# Patient Record
Sex: Female | Born: 1980 | Race: White | Hispanic: No | Marital: Married | Smoking: Never smoker
Health system: Southern US, Community
[De-identification: ages and names within clinical notes are randomized; demographics above are authoritative.]

## PROBLEM LIST (undated history)

## (undated) DIAGNOSIS — F419 Anxiety disorder, unspecified: Secondary | ICD-10-CM

## (undated) DIAGNOSIS — K219 Gastro-esophageal reflux disease without esophagitis: Secondary | ICD-10-CM

## (undated) DIAGNOSIS — I10 Essential (primary) hypertension: Secondary | ICD-10-CM

## (undated) HISTORY — DX: Essential (primary) hypertension: I10

## (undated) HISTORY — DX: Anxiety disorder, unspecified: F41.9

## (undated) HISTORY — DX: Gastro-esophageal reflux disease without esophagitis: K21.9

---
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
## 2000-01-20 ENCOUNTER — Other Ambulatory Visit: Admission: RE | Admit: 2000-01-20 | Discharge: 2000-01-20 | Payer: Self-pay | Admitting: Family Medicine

## 2001-03-16 ENCOUNTER — Other Ambulatory Visit: Admission: RE | Admit: 2001-03-16 | Discharge: 2001-03-16 | Payer: Self-pay | Admitting: Family Medicine

## 2002-02-07 ENCOUNTER — Other Ambulatory Visit: Admission: RE | Admit: 2002-02-07 | Discharge: 2002-02-07 | Payer: Self-pay | Admitting: Family Medicine

## 2003-03-13 ENCOUNTER — Other Ambulatory Visit: Admission: RE | Admit: 2003-03-13 | Discharge: 2003-03-13 | Payer: Self-pay | Admitting: Family Medicine

## 2004-02-12 ENCOUNTER — Other Ambulatory Visit: Admission: RE | Admit: 2004-02-12 | Discharge: 2004-02-12 | Payer: Self-pay | Admitting: Family Medicine

## 2005-02-19 ENCOUNTER — Other Ambulatory Visit: Admission: RE | Admit: 2005-02-19 | Discharge: 2005-02-19 | Payer: Self-pay | Admitting: Family Medicine

## 2006-04-13 ENCOUNTER — Other Ambulatory Visit: Admission: RE | Admit: 2006-04-13 | Discharge: 2006-04-13 | Payer: Self-pay | Admitting: Family Medicine

## 2007-02-15 ENCOUNTER — Other Ambulatory Visit: Admission: RE | Admit: 2007-02-15 | Discharge: 2007-02-15 | Payer: Self-pay | Admitting: Family Medicine

## 2008-04-23 ENCOUNTER — Other Ambulatory Visit: Admission: RE | Admit: 2008-04-23 | Discharge: 2008-04-23 | Payer: Self-pay | Admitting: Family Medicine

## 2008-08-17 ENCOUNTER — Ambulatory Visit (HOSPITAL_BASED_OUTPATIENT_CLINIC_OR_DEPARTMENT_OTHER): Admission: RE | Admit: 2008-08-17 | Discharge: 2008-08-17 | Payer: Self-pay | Admitting: Family Medicine

## 2009-04-24 ENCOUNTER — Other Ambulatory Visit: Admission: RE | Admit: 2009-04-24 | Discharge: 2009-04-24 | Payer: Self-pay | Admitting: Family Medicine

## 2009-10-03 ENCOUNTER — Ambulatory Visit (HOSPITAL_COMMUNITY): Admission: RE | Admit: 2009-10-03 | Discharge: 2009-10-03 | Payer: Self-pay | Admitting: Obstetrics and Gynecology

## 2010-01-16 ENCOUNTER — Inpatient Hospital Stay (HOSPITAL_COMMUNITY): Admission: AD | Admit: 2010-01-16 | Discharge: 2010-01-16 | Payer: Self-pay | Admitting: Obstetrics and Gynecology

## 2010-02-04 ENCOUNTER — Inpatient Hospital Stay (HOSPITAL_COMMUNITY): Admission: AD | Admit: 2010-02-04 | Discharge: 2010-02-07 | Payer: Self-pay | Admitting: Obstetrics and Gynecology

## 2010-04-13 IMAGING — US US ABDOMEN COMPLETE
1 series · 14 of 25 positions shown · non-contrast
Comparison: None

CLINICAL DATA: Right upper quadrant pain.  Nausea vomiting diarrhea
for 5 months.

ABDOMEN ULTRASOUND
TECHNIQUE: Complete abdominal ultrasound examination was performed
including evaluation of the liver, gallbladder, bile ducts,
pancreas, kidneys, spleen, IVC, and abdominal aorta.

[Series 1: us abdomen complete · 0.30mm/px · 14 of 73 slices shown]
[im 1/73]
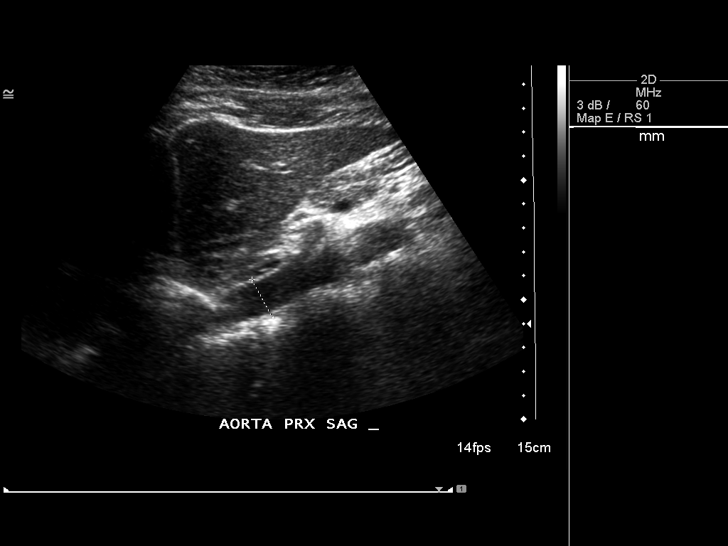
[im 7/73]
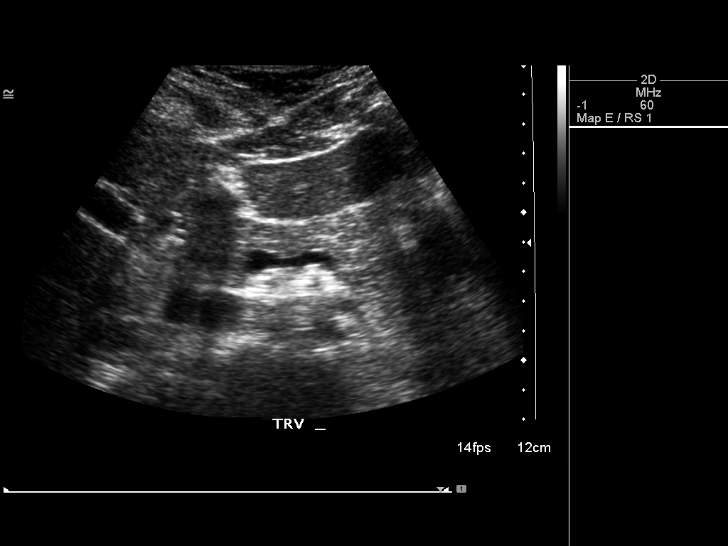
[im 13/73]
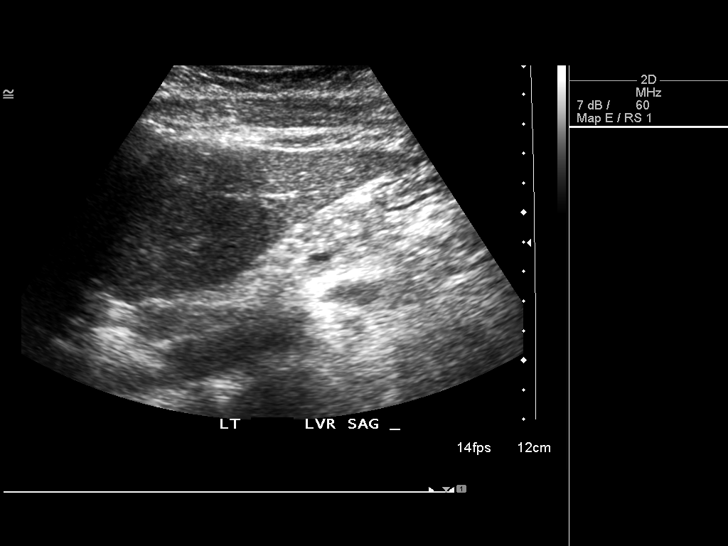
[im 19/73]
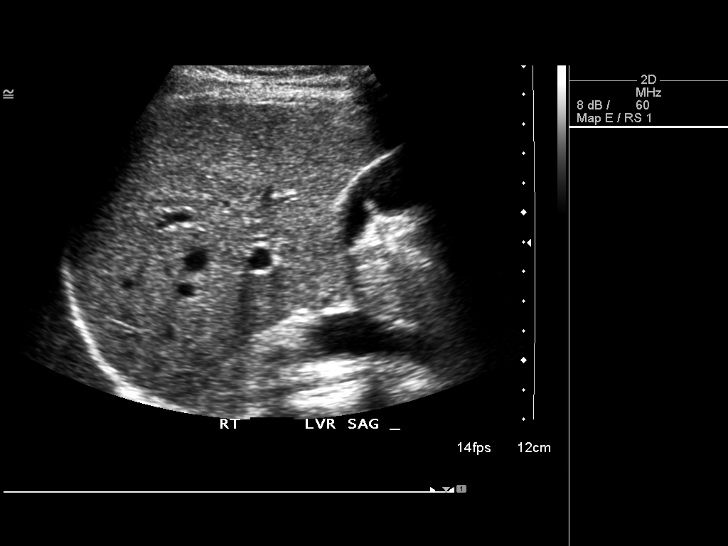
[im 25/73]
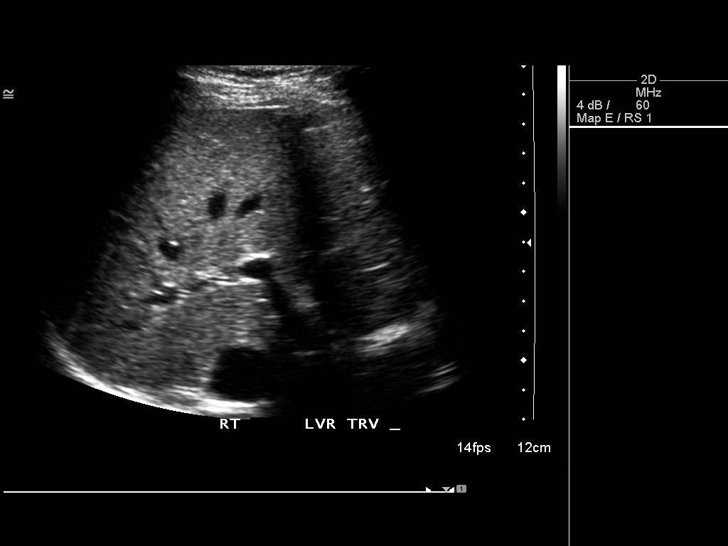
[im 28/73]
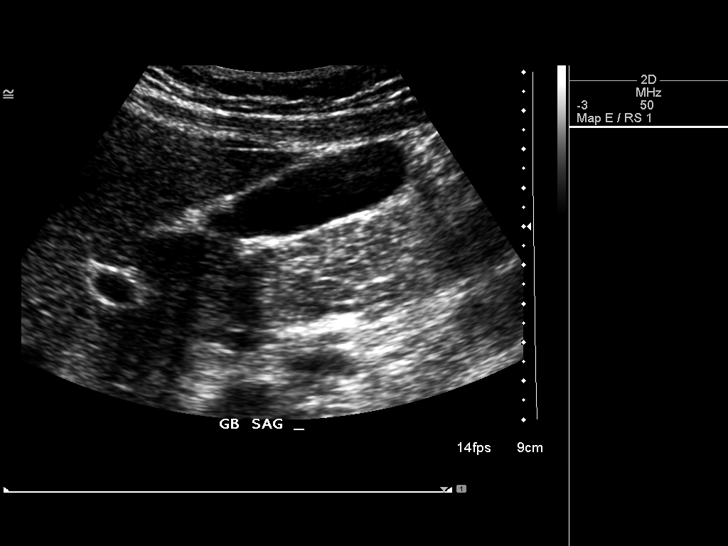
[im 34/73]
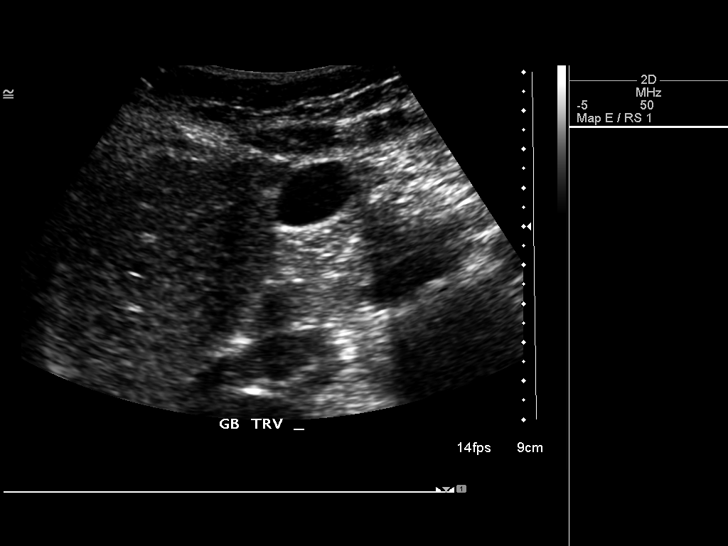
[im 40/73]
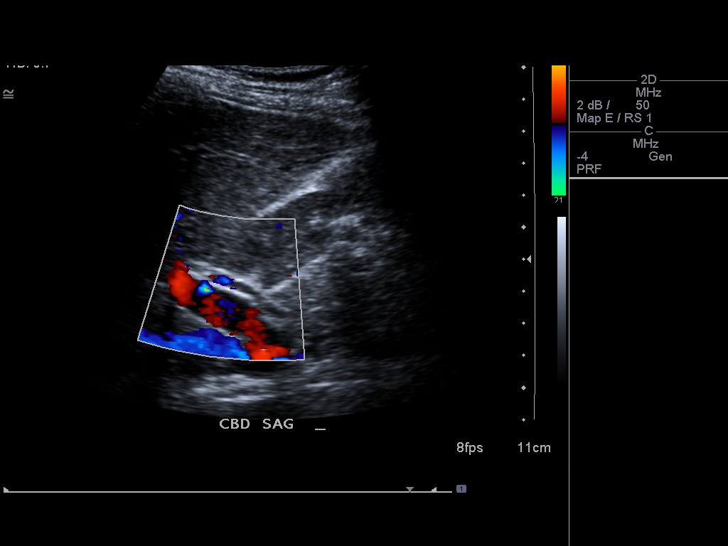
[im 46/73]
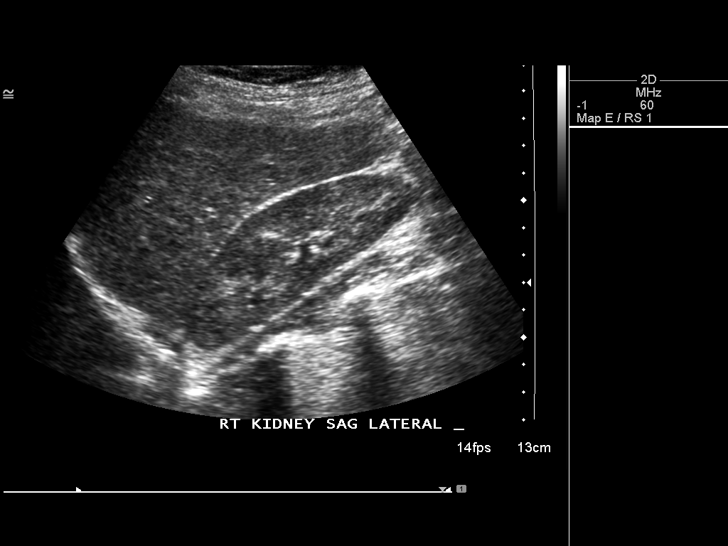
[im 49/73]
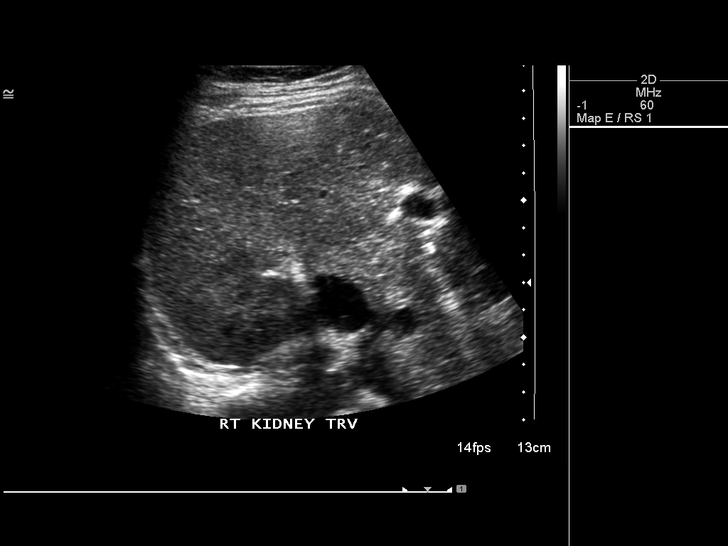
[im 55/73]
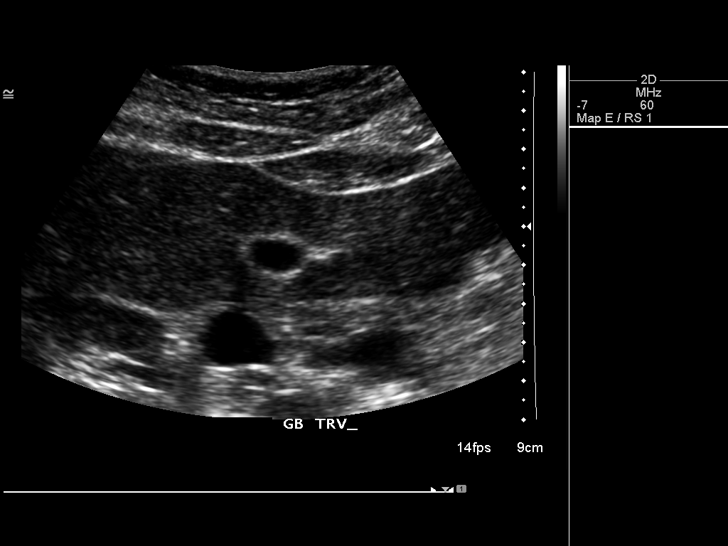
[im 61/73]
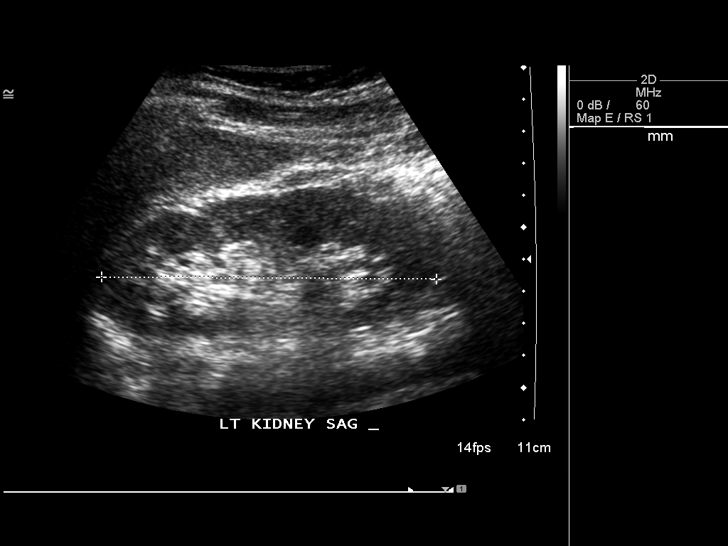
[im 67/73]
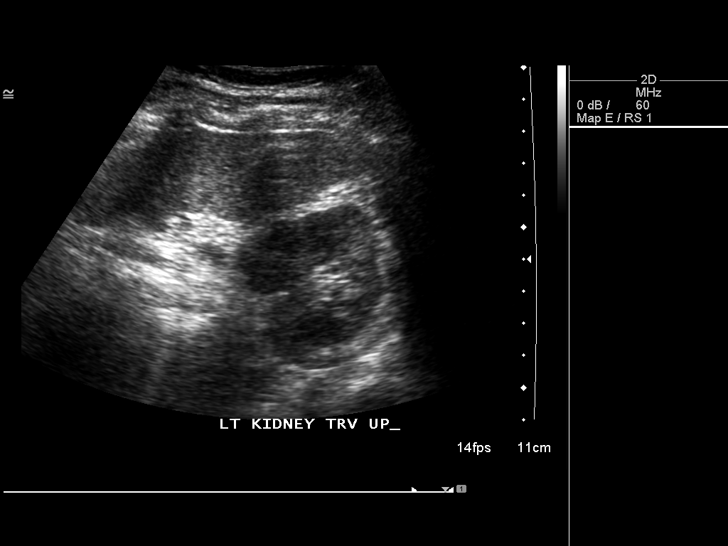
[im 73/73]
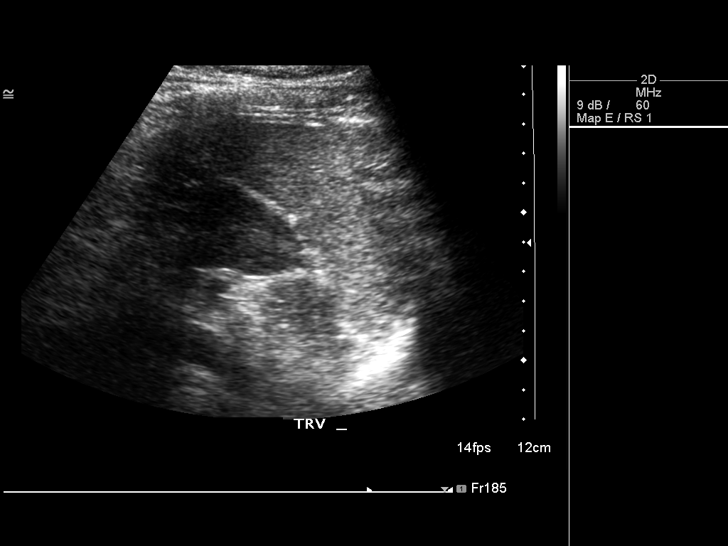

[14 of 25 positions shown; findings below may reference images not displayed]

FINDINGS: Gallbladder is normal without wall thickening, stone, or
pericholecystic fluid.  Sonographic Murphy's sign was not elicited.

Common duct normal at 3mm.

Liver, IVC, pancreas all within normal limits.

Spleen normal in size and echotexture.

Right kidney 10.9 cm and left kidney 10.4 cm.  No hydronephrosis.

Abdominal aorta nonaneurysmal without ascites.
IMPRESSION: No acute abdominal process.

## 2010-12-08 LAB — COMPREHENSIVE METABOLIC PANEL
ALT: 19 U/L (ref 0–35)
AST: 25 U/L (ref 0–37)
Albumin: 2.9 g/dL — ABNORMAL LOW (ref 3.5–5.2)
Alkaline Phosphatase: 180 U/L — ABNORMAL HIGH (ref 39–117)
Chloride: 105 mEq/L (ref 96–112)
GFR calc Af Amer: >60 mL/min (ref >60)
Sodium: 136 mEq/L (ref 135–145)
Total Bilirubin: 0.3 mg/dL (ref 0.3–1.2)

## 2010-12-08 LAB — CBC
HCT: 31.6 % — ABNORMAL LOW (ref 36.0–46.0)
MCHC: 34.1 g/dL (ref 30.0–36.0)
MCHC: 34.2 g/dL (ref 30.0–36.0)
MCV: 89.2 fL (ref 78.0–100.0)
Platelets: 140 10*3/uL — ABNORMAL LOW (ref 150–400)
Platelets: 163 10*3/uL (ref 150–400)
RBC: 4.14 MIL/uL (ref 3.87–5.11)
RBC: 4.15 MIL/uL (ref 3.87–5.11)
RDW: 13.9 % (ref 11.5–15.5)
WBC: 14.3 10*3/uL — ABNORMAL HIGH (ref 4.0–10.5)

## 2010-12-08 LAB — RPR: RPR Ser Ql: NONREACTIVE

## 2010-12-08 LAB — LACTATE DEHYDROGENASE: LDH: 188 U/L (ref 94–250)

## 2010-12-09 LAB — LACTATE DEHYDROGENASE: LDH: 166 U/L (ref 94–250)

## 2010-12-09 LAB — COMPREHENSIVE METABOLIC PANEL
ALT: 16 U/L (ref 0–35)
AST: 22 U/L (ref 0–37)
Albumin: 2.6 g/dL — ABNORMAL LOW (ref 3.5–5.2)
CO2: 25 mEq/L (ref 19–32)
Calcium: 8.9 mg/dL (ref 8.4–10.5)
Chloride: 104 mEq/L (ref 96–112)
Creatinine, Ser: 0.5 mg/dL (ref 0.4–1.2)
Total Bilirubin: 0.4 mg/dL (ref 0.3–1.2)

## 2010-12-09 LAB — CBC
MCHC: 34.4 g/dL (ref 30.0–36.0)
MCV: 88.6 fL (ref 78.0–100.0)
Platelets: 197 10*3/uL (ref 150–400)
RBC: 4.08 MIL/uL (ref 3.87–5.11)
RDW: 13 % (ref 11.5–15.5)
WBC: 9.4 10*3/uL (ref 4.0–10.5)

## 2010-12-09 LAB — URIC ACID: Uric Acid, Serum: 3.7 mg/dL (ref 2.4–7.0)

## 2011-05-30 IMAGING — US US ABDOMEN COMPLETE
1 series · 13 of 25 positions shown · non-contrast
Comparison: 08/17/2008

CLINICAL DATA: Right flank pain.  21 weeks estimated gestational
age

COMPLETE ABDOMINAL ULTRASOUND

[Series 1: us abdomen complete · 0.35mm/px · 13 of 73 slices shown]
[im 1/73]
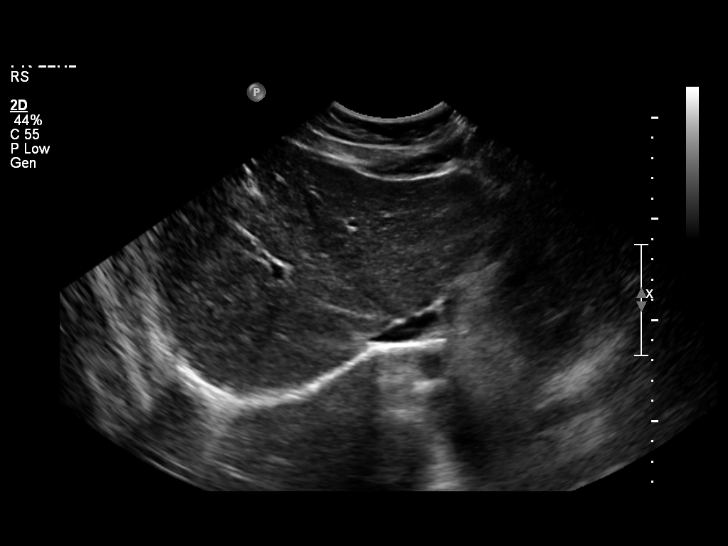
[im 7/73]
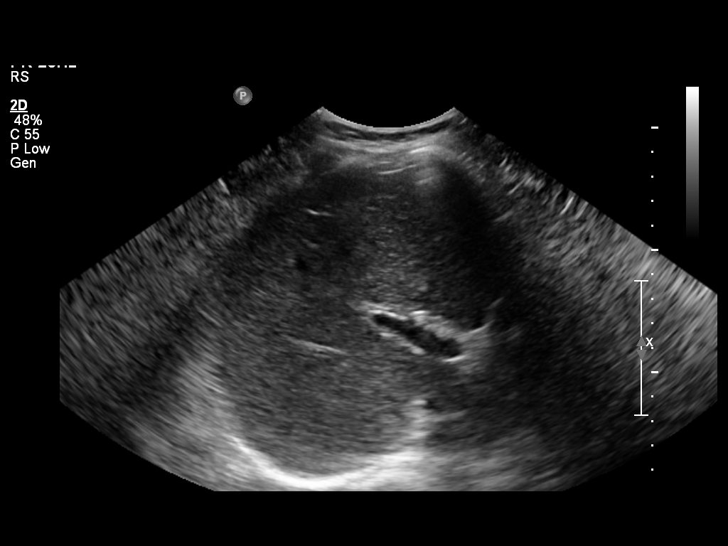
[im 13/73]
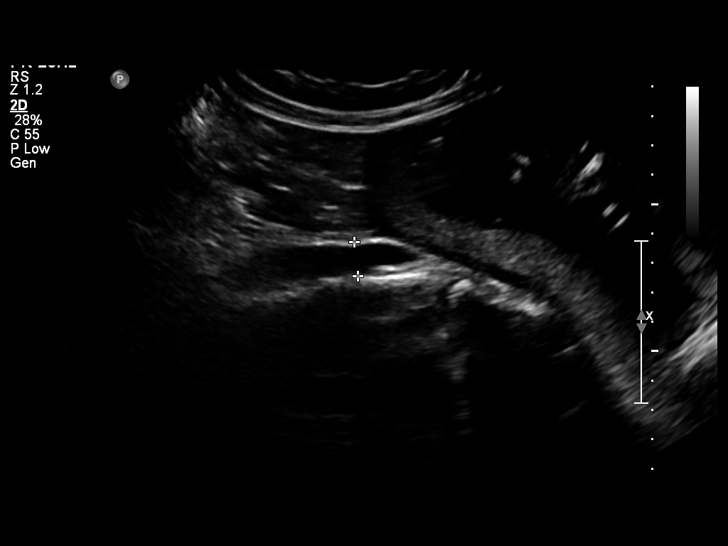
[im 19/73]
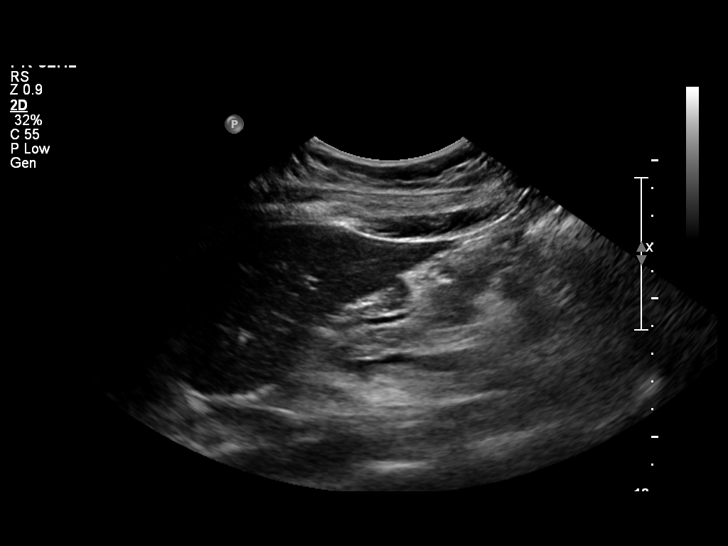
[im 25/73]
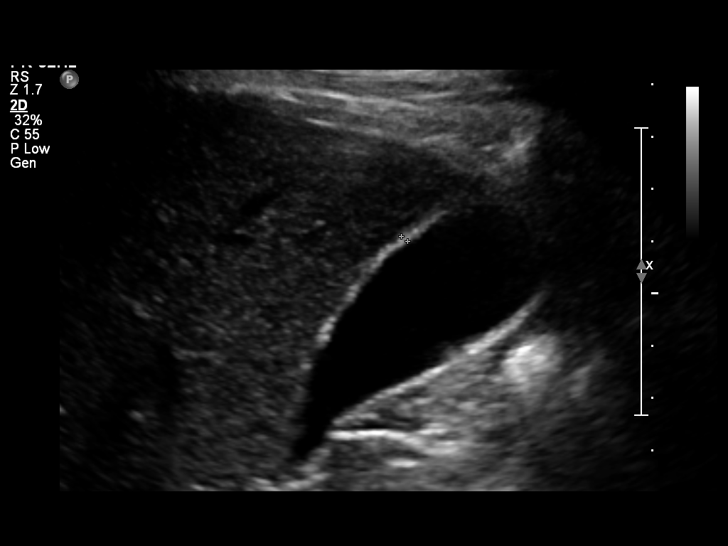
[im 31/73]
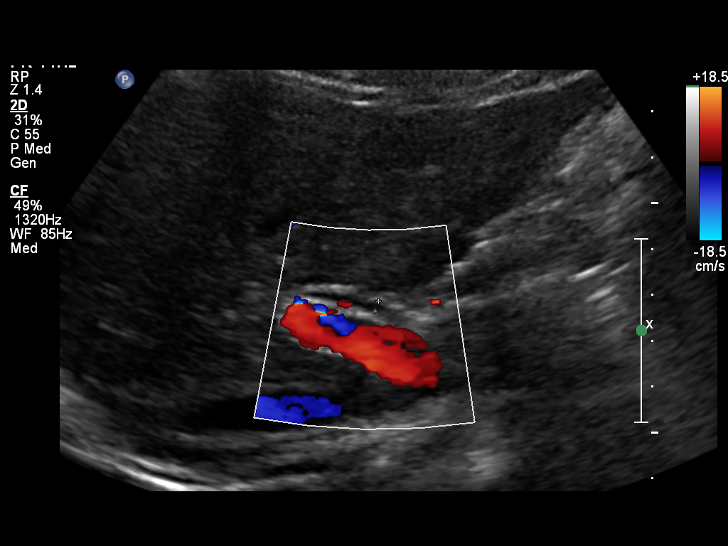
[im 37/73]
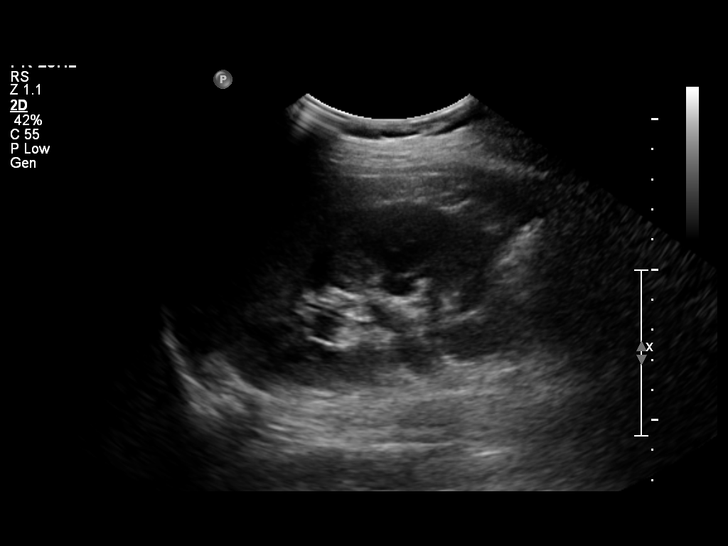
[im 43/73]
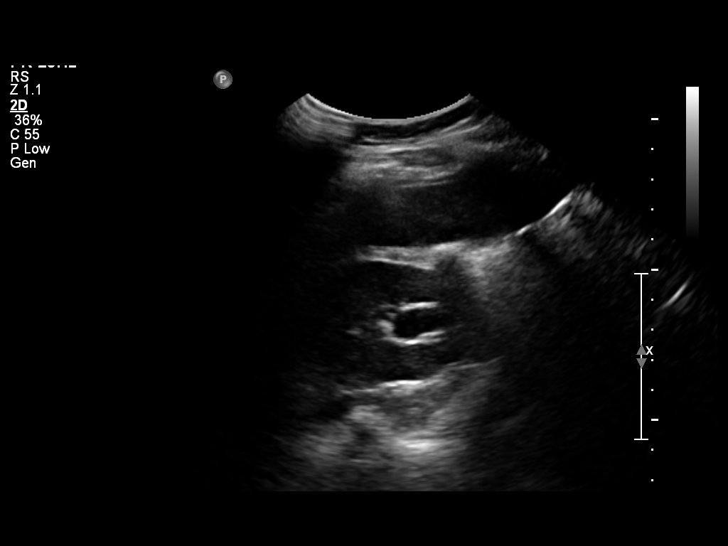
[im 49/73]
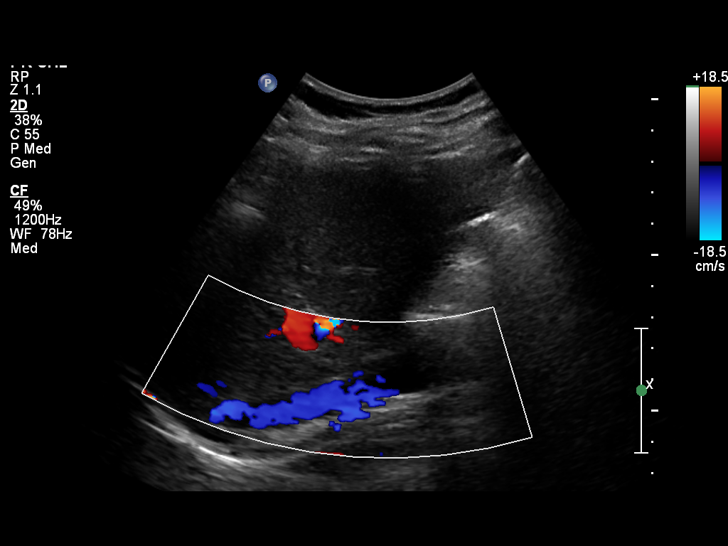
[im 55/73]
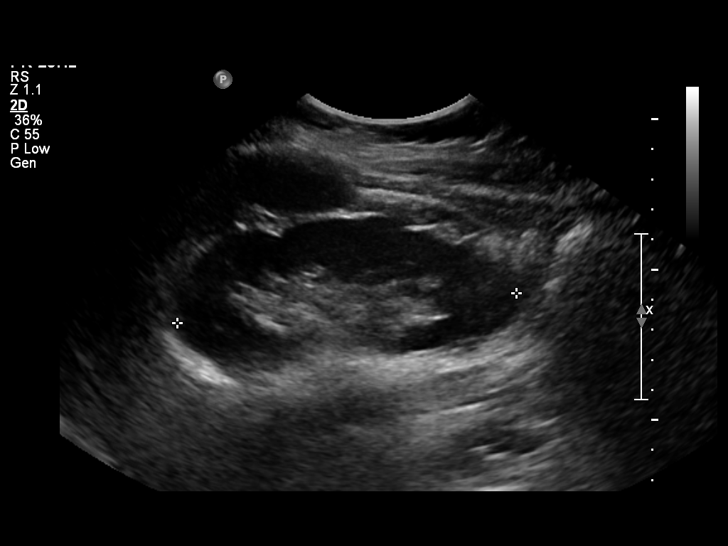
[im 61/73]
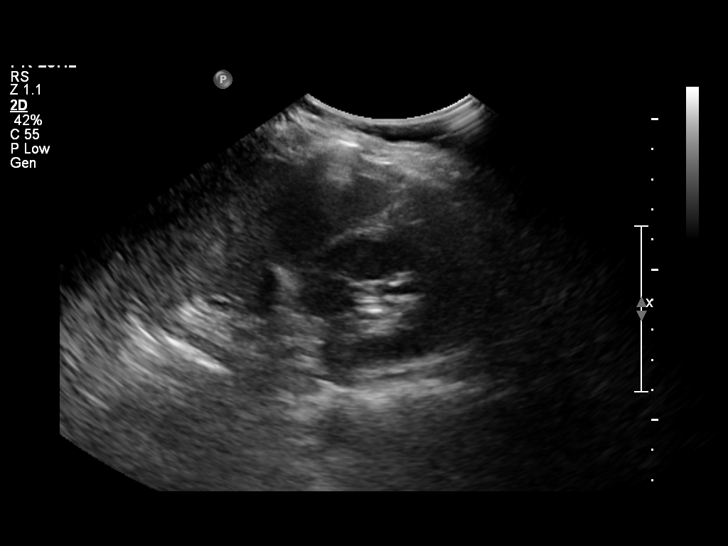
[im 67/73]
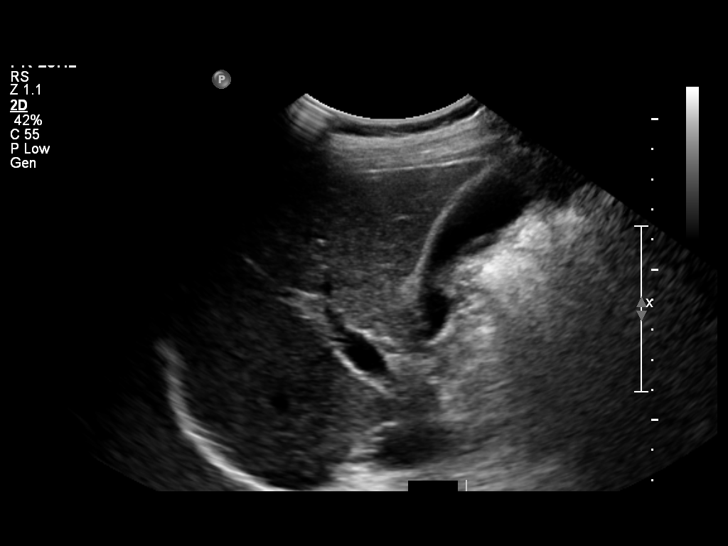
[im 73/73]
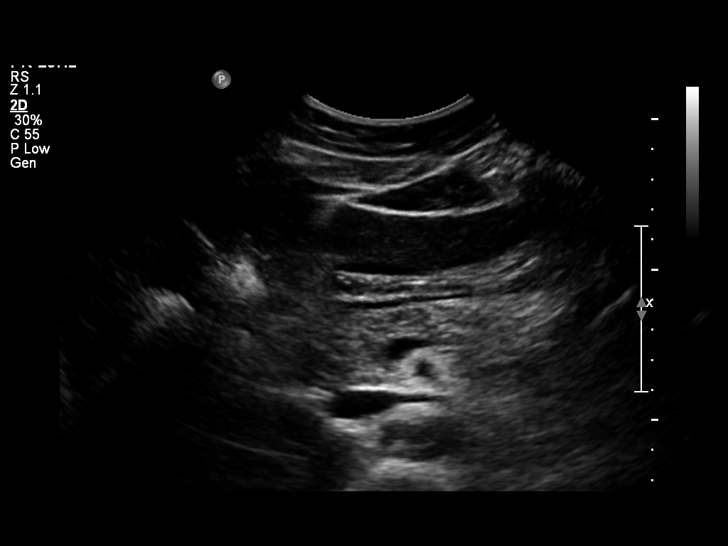

[13 of 25 positions shown; findings below may reference images not displayed]

FINDINGS: Gallbladder:  No gallstones, gallbladder wall thickening, or
pericholecystic fluid.

Common bile duct:  Measures 2.3 mm in AP width and has a normal
appearance.

Liver:  No focal lesion identified.  Within normal limits in
parenchymal echogenicity. No signs of intrahepatic ductal
dilatation are seen

IVC:  Appears normal.

Pancreas:  Normal in appearance in echogenicity in the region of
the head and body.  Portions of the tail are obscured by overlying
gas.

Spleen:  Demonstrates a sagittal length of 10.4 cm.  No focal
parenchymal abnormalities are seen and echogenicity is normal.

Right Kidney:  Demonstrates a sagittal length of 10.3 cm.  There is
some focal dilatation of the upper pole collecting system.  No
definite ureterectasis is seen and no focal parenchymal
abnormalities are noted

Left Kidney:  Demonstrates a sagittal length of 11.3 cm.  No focal
parenchymal abnormalities or signs of hydronephrosis are noted

Abdominal aorta:  No aneurysm identified.
IMPRESSION: Mild pyelectasis of the upper pole of the right collecting system
with no diffuse pyelectasis or ureterectasis seen.  No focal
parenchymal abnormality is noted to suggest the presence of renal
calculi and the degree of pyelectasis is not unexpected for the
patient's current estimated gestational age.  Otherwise
unremarkable abdominal ultrasound.

## 2013-11-08 ENCOUNTER — Telehealth: Payer: Self-pay | Admitting: *Deleted

## 2013-11-08 NOTE — Telephone Encounter (Signed)
error 

## 2017-09-16 LAB — OB RESULTS CONSOLE HEPATITIS B SURFACE ANTIGEN: HEP B S AG: NEGATIVE

## 2017-09-16 LAB — OB RESULTS CONSOLE ABO/RH: RH TYPE: POSITIVE

## 2017-09-16 LAB — OB RESULTS CONSOLE GC/CHLAMYDIA
Chlamydia: NEGATIVE
Gonorrhea: NEGATIVE

## 2017-09-16 LAB — OB RESULTS CONSOLE HIV ANTIBODY (ROUTINE TESTING): HIV: NONREACTIVE

## 2017-09-16 LAB — OB RESULTS CONSOLE RPR: RPR: NONREACTIVE

## 2017-09-16 LAB — OB RESULTS CONSOLE ANTIBODY SCREEN: Antibody Screen: NEGATIVE

## 2017-09-16 LAB — OB RESULTS CONSOLE RUBELLA ANTIBODY, IGM: Rubella: NON-IMMUNE/NOT IMMUNE

## 2017-09-21 NOTE — L&D Delivery Note (Signed)
Delivery Note At 8:04 PM a healthy female was delivered via Vaginal, Spontaneous (Presentation: LOA  ).  APGAR:8,9 ; weight pending .   Placenta status: delivered spontaneously.  Cord:  with the following complications: none.    Anesthesia:  epidural Episiotomy: None Lacerations:  Small second degree Suture Repair: 3.0 vicryl rapide Est. Blood Loss (mL):  175ml  Mom to postpartum.  Baby to Couplet care / Skin to Skin.  D/w pt circumcision and they desire to proceed, have not paid at office yet  Oliver PilaKathy W Clelia Trabucco 04/08/2018, 8:33 PM

## 2018-03-18 LAB — OB RESULTS CONSOLE GBS: GBS: NEGATIVE

## 2018-03-21 ENCOUNTER — Telehealth (HOSPITAL_COMMUNITY): Payer: Self-pay | Admitting: *Deleted

## 2018-03-21 ENCOUNTER — Encounter (HOSPITAL_COMMUNITY): Payer: Self-pay | Admitting: *Deleted

## 2018-03-21 NOTE — Telephone Encounter (Signed)
Preadmission screen  

## 2018-03-22 ENCOUNTER — Telehealth (HOSPITAL_COMMUNITY): Payer: Self-pay | Admitting: *Deleted

## 2018-03-22 NOTE — Telephone Encounter (Signed)
Preadmission screen  

## 2018-04-04 ENCOUNTER — Inpatient Hospital Stay (HOSPITAL_COMMUNITY): Admission: RE | Admit: 2018-04-04 | Payer: Self-pay | Source: Ambulatory Visit

## 2018-04-07 ENCOUNTER — Encounter (HOSPITAL_COMMUNITY): Payer: Self-pay

## 2018-04-07 NOTE — H&P (Signed)
Sonya Peters is a 37 y.o. female G2P1001 at 3939 0/7 weeks (EDD 04/14/18 by LMP c/w 9 week US) presenting for IOL at term with h/o CHTN, well-controlled on minimal medications entire pregnancy.  She is AMA but declined genetic screens.  There was a low lying placenta that resolved on f/u scan and though fundal height was larger than dates EFW is 58%ile on US.   SHe is rubella non-immune. OB History    Gravida  2   Para  1   Term  1   Preterm      AB      Living  1     SAB      TAB      Ectopic      Multiple      Live Births  1         2011 PIH 40+ weeks 7#15oz NSVD  Past Medical History:  Diagnosis Date  . Anxiety   . GERD (gastroesophageal reflux disease)   . Hypertension    Family History: family history is not on file. Social History:  has no tobacco, alcohol, and drug history on file.     Maternal Diabetes: No Genetic Screening: Declined Maternal Ultrasounds/Referrals: Normal Fetal Ultrasounds or other Referrals:  None Maternal Substance Abuse:  No Significant Maternal Medications:  Meds include: Other:   Labetalol Significant Maternal Lab Results:  Lab values include: Other: Rubella non-immune Other Comments:  None  Review of Systems  Eyes: Negative for blurred vision.  Gastrointestinal: Negative for abdominal pain.  Neurological: Negative for headaches.   Maternal Medical History:  Contractions: Frequency: irregular.   Perceived severity is mild.    Fetal activity: Perceived fetal activity is normal.    Prenatal complications: PIH.   Prenatal Complications - Diabetes: none.      Last menstrual period 07/08/2017. Maternal Exam:  Uterine Assessment: Contraction strength is mild.  Contraction frequency is irregular.   Abdomen: Patient reports no abdominal tenderness. Fetal presentation: vertex  Introitus: Normal vulva. Normal vagina.  Pelvis: adequate for delivery.      Physical Exam  Constitutional: She appears well-developed.   Cardiovascular: Normal rate and regular rhythm.  Respiratory: Effort normal.  GI: Soft.  Genitourinary: Vagina normal.  Musculoskeletal: Normal range of motion.  Neurological: She is alert.  Psychiatric: She has a normal mood and affect.    Prenatal labs: ABO, Rh: O/Positive/-- (12/27 0000) Antibody: Negative (12/27 0000) Rubella: Nonimmune (12/27 0000) RPR: Nonreactive (12/27 0000)  HBsAg: Negative (12/27 0000)  HIV: Non-reactive (12/27 0000)  GBS:   Negative One hour GCT WNL  Assessment/Plan: Pt for IOL at term with well-controlled CTHN on labetalol.  Plan pitocin or cytotec and AROM when able.  Oliver PilaKathy W Avantika Shere 04/07/2018, 11:17 PM

## 2018-04-08 ENCOUNTER — Inpatient Hospital Stay (HOSPITAL_COMMUNITY): Payer: BLUE CROSS/BLUE SHIELD | Admitting: Anesthesiology

## 2018-04-08 ENCOUNTER — Encounter (HOSPITAL_COMMUNITY): Payer: Self-pay

## 2018-04-08 ENCOUNTER — Other Ambulatory Visit: Payer: Self-pay

## 2018-04-08 ENCOUNTER — Inpatient Hospital Stay (HOSPITAL_COMMUNITY)
Admission: RE | Admit: 2018-04-08 | Discharge: 2018-04-10 | DRG: 807 | Disposition: A | Discharge status: Home / Self Care | Payer: BLUE CROSS/BLUE SHIELD | Attending: Obstetrics and Gynecology | Admitting: Obstetrics and Gynecology

## 2018-04-08 DIAGNOSIS — Z3A39 39 weeks gestation of pregnancy: Secondary | ICD-10-CM | POA: Diagnosis not present

## 2018-04-08 DIAGNOSIS — O1002 Pre-existing essential hypertension complicating childbirth: Principal | ICD-10-CM | POA: Diagnosis present

## 2018-04-08 LAB — TYPE AND SCREEN
ABO/RH(D): O POS
Antibody Screen: NEGATIVE

## 2018-04-08 LAB — COMPREHENSIVE METABOLIC PANEL
ALK PHOS: 168 U/L — AB (ref 38–126)
ALT: 28 U/L (ref 0–44)
AST: 29 U/L (ref 15–41)
Albumin: 3 g/dL — ABNORMAL LOW (ref 3.5–5.0)
Anion gap: 11 (ref 5–15)
BILIRUBIN TOTAL: 0.2 mg/dL — AB (ref 0.3–1.2)
BUN: 11 mg/dL (ref 6–20)
CALCIUM: 9.5 mg/dL (ref 8.9–10.3)
CO2: 20 mmol/L — ABNORMAL LOW (ref 22–32)
CREATININE: 0.5 mg/dL (ref 0.44–1.00)
Chloride: 103 mmol/L (ref 98–111)
Glucose, Bld: 102 mg/dL — ABNORMAL HIGH (ref 70–99)
Potassium: 3.8 mmol/L (ref 3.5–5.1)
Sodium: 134 mmol/L — ABNORMAL LOW (ref 135–145)
Total Protein: 6.4 g/dL — ABNORMAL LOW (ref 6.5–8.1)

## 2018-04-08 LAB — CBC
HCT: 36.1 % (ref 36.0–46.0)
HEMATOCRIT: 36.5 % (ref 36.0–46.0)
HEMOGLOBIN: 12.4 g/dL (ref 12.0–15.0)
Hemoglobin: 12.4 g/dL (ref 12.0–15.0)
MCH: 30 pg (ref 26.0–34.0)
MCH: 30 pg (ref 26.0–34.0)
MCHC: 34.3 g/dL (ref 30.0–36.0)
MCHC: 34 g/dL (ref 30.0–36.0)
MCV: 87.4 fL (ref 78.0–100.0)
MCV: 88.4 fL (ref 78.0–100.0)
PLATELETS: 163 10*3/uL (ref 150–400)
Platelets: 161 10*3/uL (ref 150–400)
RBC: 4.13 MIL/uL (ref 3.87–5.11)
RBC: 4.13 MIL/uL (ref 3.87–5.11)
RDW: 13.5 % (ref 11.5–15.5)
RDW: 13.4 % (ref 11.5–15.5)
WBC: 8.3 10*3/uL (ref 4.0–10.5)
WBC: 12 10*3/uL — ABNORMAL HIGH (ref 4.0–10.5)

## 2018-04-08 LAB — RPR: RPR Ser Ql: NONREACTIVE

## 2018-04-08 LAB — ABO/RH: ABO/RH(D): O POS

## 2018-04-08 MED ORDER — LACTATED RINGERS IV SOLN: 500.0000 mL | Freq: Once | INTRAVENOUS | Status: DC

## 2018-04-08 MED ORDER — ONDANSETRON HCL 4 MG PO TABS: 4.0000 mg | ORAL_TABLET | ORAL | Status: DC | PRN

## 2018-04-08 MED ORDER — OXYTOCIN BOLUS FROM INFUSION
500.0000 mL | Freq: Once | INTRAVENOUS | Status: AC
  Administered 2018-04-08: 500 mL via INTRAVENOUS

## 2018-04-08 MED ORDER — OXYCODONE-ACETAMINOPHEN 5-325 MG PO TABS: 2.0000 | ORAL_TABLET | ORAL | Status: DC | PRN

## 2018-04-08 MED ORDER — PHENYLEPHRINE 40 MCG/ML (10ML) SYRINGE FOR IV PUSH (FOR BLOOD PRESSURE SUPPORT)
PREFILLED_SYRINGE | INTRAVENOUS | Status: AC
  Filled 2018-04-08: qty 10

## 2018-04-08 MED ORDER — PRENATAL MULTIVITAMIN CH
1.0000 | ORAL_TABLET | Freq: Every day | ORAL | Status: DC
  Administered 2018-04-09 – 2018-04-10 (×2): 1 via ORAL
  Filled 2018-04-08 (×2): qty 1

## 2018-04-08 MED ORDER — SIMETHICONE 80 MG PO CHEW
80.0000 mg | CHEWABLE_TABLET | ORAL | Status: DC | PRN
  Administered 2018-04-09: 80 mg via ORAL
  Filled 2018-04-08: qty 1

## 2018-04-08 MED ORDER — LIDOCAINE HCL (PF) 1 % IJ SOLN
INTRAMUSCULAR | Status: DC | PRN
  Administered 2018-04-08: 13 mL via EPIDURAL

## 2018-04-08 MED ORDER — LACTATED RINGERS IV SOLN
500.0000 mL | INTRAVENOUS | Status: DC | PRN
  Administered 2018-04-08 (×2): 500 mL via INTRAVENOUS

## 2018-04-08 MED ORDER — FENTANYL 2.5 MCG/ML BUPIVACAINE 1/10 % EPIDURAL INFUSION (WH - ANES)
INTRAMUSCULAR | Status: AC
  Filled 2018-04-08: qty 100

## 2018-04-08 MED ORDER — SOD CITRATE-CITRIC ACID 500-334 MG/5ML PO SOLN: 30.0000 mL | ORAL | Status: DC | PRN

## 2018-04-08 MED ORDER — PHENYLEPHRINE 40 MCG/ML (10ML) SYRINGE FOR IV PUSH (FOR BLOOD PRESSURE SUPPORT): 80.0000 ug | PREFILLED_SYRINGE | INTRAVENOUS | Status: DC | PRN

## 2018-04-08 MED ORDER — EPHEDRINE 5 MG/ML INJ: 10.0000 mg | INTRAVENOUS | Status: DC | PRN

## 2018-04-08 MED ORDER — TETANUS-DIPHTH-ACELL PERTUSSIS 5-2.5-18.5 LF-MCG/0.5 IM SUSP: 0.5000 mL | Freq: Once | INTRAMUSCULAR | Status: DC

## 2018-04-08 MED ORDER — OXYCODONE-ACETAMINOPHEN 5-325 MG PO TABS: 1.0000 | ORAL_TABLET | ORAL | Status: DC | PRN

## 2018-04-08 MED ORDER — ONDANSETRON HCL 4 MG/2ML IJ SOLN: 4.0000 mg | Freq: Four times a day (QID) | INTRAMUSCULAR | Status: DC | PRN

## 2018-04-08 MED ORDER — PHENYLEPHRINE 40 MCG/ML (10ML) SYRINGE FOR IV PUSH (FOR BLOOD PRESSURE SUPPORT)
80.0000 ug | PREFILLED_SYRINGE | INTRAVENOUS | Status: DC | PRN
  Filled 2018-04-08: qty 10

## 2018-04-08 MED ORDER — DIBUCAINE 1 % RE OINT: 1.0000 "application " | TOPICAL_OINTMENT | RECTAL | Status: DC | PRN

## 2018-04-08 MED ORDER — BENZOCAINE-MENTHOL 20-0.5 % EX AERO
1.0000 "application " | INHALATION_SPRAY | CUTANEOUS | Status: DC | PRN
  Administered 2018-04-09: 1 via TOPICAL
  Filled 2018-04-08: qty 56

## 2018-04-08 MED ORDER — LIDOCAINE-EPINEPHRINE (PF) 2 %-1:200000 IJ SOLN
INTRAMUSCULAR | Status: DC | PRN
  Administered 2018-04-08: 5 mL via EPIDURAL
  Administered 2018-04-08: 4 mL via EPIDURAL

## 2018-04-08 MED ORDER — LABETALOL HCL 100 MG PO TABS
100.0000 mg | ORAL_TABLET | Freq: Two times a day (BID) | ORAL | Status: DC
  Administered 2018-04-09 (×2): 100 mg via ORAL
  Filled 2018-04-08 (×3): qty 1

## 2018-04-08 MED ORDER — OXYTOCIN 40 UNITS IN LACTATED RINGERS INFUSION - SIMPLE MED
2.5000 [IU]/h | INTRAVENOUS | Status: DC
  Administered 2018-04-08: 2.5 [IU]/h via INTRAVENOUS
  Filled 2018-04-08: qty 1000

## 2018-04-08 MED ORDER — FENTANYL 2.5 MCG/ML BUPIVACAINE 1/10 % EPIDURAL INFUSION (WH - ANES)
14.0000 mL/h | INTRAMUSCULAR | Status: DC | PRN
  Administered 2018-04-08: 14 mL/h via EPIDURAL

## 2018-04-08 MED ORDER — DIPHENHYDRAMINE HCL 50 MG/ML IJ SOLN: 12.5000 mg | INTRAMUSCULAR | Status: DC | PRN

## 2018-04-08 MED ORDER — ZOLPIDEM TARTRATE 5 MG PO TABS: 5.0000 mg | ORAL_TABLET | Freq: Every evening | ORAL | Status: DC | PRN

## 2018-04-08 MED ORDER — WITCH HAZEL-GLYCERIN EX PADS: 1.0000 "application " | MEDICATED_PAD | CUTANEOUS | Status: DC | PRN

## 2018-04-08 MED ORDER — SENNOSIDES-DOCUSATE SODIUM 8.6-50 MG PO TABS
2.0000 | ORAL_TABLET | ORAL | Status: DC
  Administered 2018-04-08 – 2018-04-09 (×2): 2 via ORAL
  Filled 2018-04-08 (×2): qty 2

## 2018-04-08 MED ORDER — DIPHENHYDRAMINE HCL 25 MG PO CAPS
25.0000 mg | ORAL_CAPSULE | Freq: Four times a day (QID) | ORAL | Status: DC | PRN
  Filled 2018-04-08: qty 1

## 2018-04-08 MED ORDER — LACTATED RINGERS IV SOLN
INTRAVENOUS | Status: DC
  Administered 2018-04-08 (×3): via INTRAVENOUS

## 2018-04-08 MED ORDER — ACETAMINOPHEN 325 MG PO TABS: 650.0000 mg | ORAL_TABLET | ORAL | Status: DC | PRN

## 2018-04-08 MED ORDER — TERBUTALINE SULFATE 1 MG/ML IJ SOLN: 0.2500 mg | Freq: Once | INTRAMUSCULAR | Status: DC | PRN

## 2018-04-08 MED ORDER — IBUPROFEN 600 MG PO TABS
600.0000 mg | ORAL_TABLET | Freq: Four times a day (QID) | ORAL | Status: DC
  Administered 2018-04-08 – 2018-04-10 (×6): 600 mg via ORAL
  Filled 2018-04-08 (×7): qty 1

## 2018-04-08 MED ORDER — COCONUT OIL OIL
1.0000 "application " | TOPICAL_OIL | Status: DC | PRN
  Administered 2018-04-09: 1 via TOPICAL
  Filled 2018-04-08: qty 120

## 2018-04-08 MED ORDER — LIDOCAINE HCL (PF) 1 % IJ SOLN
30.0000 mL | INTRAMUSCULAR | Status: DC | PRN
  Filled 2018-04-08: qty 30

## 2018-04-08 MED ORDER — OXYTOCIN 40 UNITS IN LACTATED RINGERS INFUSION - SIMPLE MED
1.0000 m[IU]/min | INTRAVENOUS | Status: DC
  Administered 2018-04-08: 2 m[IU]/min via INTRAVENOUS

## 2018-04-08 MED ORDER — ONDANSETRON HCL 4 MG/2ML IJ SOLN: 4.0000 mg | INTRAMUSCULAR | Status: DC | PRN

## 2018-04-08 MED ORDER — ACETAMINOPHEN 325 MG PO TABS
650.0000 mg | ORAL_TABLET | ORAL | Status: DC | PRN
  Administered 2018-04-09 (×3): 650 mg via ORAL
  Filled 2018-04-08 (×3): qty 2

## 2018-04-08 NOTE — Progress Notes (Signed)
Patient ID: Sonya Peters, female   DOB: [birth year], 37 y.o.   MRN: [id number] Pt feeling mild contractions  afeb VSS FHR Category 1  Cervix 50/1/-3 posterior Willl wait on AROM until station improves

## 2018-04-08 NOTE — Anesthesia Preprocedure Evaluation (Signed)
Anesthesia Evaluation  Patient identified by MRN, date of birth, ID band Patient awake    Reviewed: Allergy & Precautions, NPO status , Patient's Chart, lab work & pertinent test results  Airway Mallampati: II  TM Distance: >3 FB Neck ROM: Full    Dental no notable dental hx.    Pulmonary neg pulmonary ROS,    Pulmonary exam normal breath sounds clear to auscultation       Cardiovascular hypertension, negative cardio ROS Normal cardiovascular exam Rhythm:Regular Rate:Normal     Neuro/Psych negative neurological ROS  negative psych ROS   GI/Hepatic negative GI ROS, Neg liver ROS,   Endo/Other  negative endocrine ROS  Renal/GU negative Renal ROS  negative genitourinary   Musculoskeletal negative musculoskeletal ROS (+)   Abdominal   Peds negative pediatric ROS (+)  Hematology negative hematology ROS (+)   Anesthesia Other Findings   Reproductive/Obstetrics negative OB ROS (+) Pregnancy                             Anesthesia Physical Anesthesia Plan  ASA: II  Anesthesia Plan: Epidural   Post-op Pain Management:    Induction:   PONV Risk Score and Plan:   Airway Management Planned:   Additional Equipment:   Intra-op Plan:   Post-operative Plan:   Informed Consent:   Plan Discussed with:   Anesthesia Plan Comments:         Anesthesia Quick Evaluation  

## 2018-04-08 NOTE — Progress Notes (Signed)
Patient ID: Sonya Peters, female   DOB: 03/13/1981, 37 y.o.   MRN: [id number] Pt had SROM of clear fluid 1100, contractions still mild  afeb VSS  Cervix 60/2/-2 posterior  Continue to increase pitocin Follow progress

## 2018-04-08 NOTE — Anesthesia Pain Management Evaluation Note (Signed)
  CRNA Pain Management Visit Note  Patient: Jonah Blueracey Salva, 37 y.o., female  "Hello I am a [facility] of the anesthesia [facility] at [facility]. We have an anesthesia team available at all times to provide care throughout the hospital, including epidural management and anesthesia for C-section. I don't know your plan for the delivery whether it a natural birth, water birth, IV sedation, nitrous supplementation, doula or epidural, but we want to meet your pain goals."   1.Was your pain managed to your expectations on prior hospitalizations?   Yes   2.What is your expectation for pain management during this hospitalization?     Epidural  3.How can we help you reach that goal? unsure  Record the patient's initial score and the patient's pain goal.   Pain: 0  Pain Goal: 5 [facility] wants you to be able to say your pain was always managed very well.  Cephus ShellingBURGER,Ericson Nafziger 04/08/2018

## 2018-04-08 NOTE — Progress Notes (Signed)
Patient ID: Sonya Peters, female   DOB: 01-14-1981, 37 y.o.   MRN: [id number] Pt got uncomfortable and received epidural  afeb vss FHR with good variability but repetitive variable decels and contractions q 1-2 mins with little resting tone  3/80/-1 IUPC placed   Will d/c pitocin x 15-30 minutes and restart pitocin at 8 mu if FHR variables correct. Follow progress

## 2018-04-08 NOTE — Anesthesia Procedure Notes (Signed)
Epidural Patient location during procedure: OB Start time: 04/08/2018 3:38 PM End time: 04/08/2018 3:50 PM  Staffing Anesthesiologist: Lowella CurbMiller, Kemoni Quesenberry Ray, MD Performed: anesthesiologist   Preanesthetic Checklist Completed: patient identified, site marked, surgical consent, pre-op evaluation, timeout performed, IV checked, risks and benefits discussed and monitors and equipment checked  Epidural Patient position: sitting Prep: ChloraPrep Patient monitoring: heart rate, cardiac monitor, continuous pulse ox and blood pressure Approach: midline Location: L2-L3 Injection technique: LOR saline  Needle:  Needle type: Tuohy  Needle gauge: 17 G Needle length: 9 cm Needle insertion depth: 6 cm Catheter type: closed end flexible Catheter size: 20 Guage Catheter at skin depth: 10 cm Test dose: negative  Assessment Events: blood not aspirated, injection not painful, no injection resistance, negative IV test and no paresthesia  Additional Notes Reason for block:procedure for pain

## 2018-04-09 LAB — CBC
HEMATOCRIT: 34.2 % — AB (ref 36.0–46.0)
HEMOGLOBIN: 11.7 g/dL — AB (ref 12.0–15.0)
MCH: 29.9 pg (ref 26.0–34.0)
MCHC: 34.2 g/dL (ref 30.0–36.0)
MCV: 87.5 fL (ref 78.0–100.0)
Platelets: 143 10*3/uL — ABNORMAL LOW (ref 150–400)
RBC: 3.91 MIL/uL (ref 3.87–5.11)
RDW: 13.4 % (ref 11.5–15.5)
WBC: 14.2 10*3/uL — ABNORMAL HIGH (ref 4.0–10.5)

## 2018-04-09 NOTE — Anesthesia Postprocedure Evaluation (Signed)
Anesthesia Post Note  Patient: Sonya Peters  Procedure(s) Performed: AN AD HOC LABOR EPIDURAL     Patient location during evaluation: Mother Baby Anesthesia Type: Epidural Level of consciousness: awake and alert Pain management: pain level controlled Vital Signs Assessment: post-procedure vital signs reviewed and stable Respiratory status: spontaneous breathing Cardiovascular status: blood pressure returned to baseline and stable Postop Assessment: no headache, no backache, no apparent nausea or vomiting, epidural receding, adequate PO intake and able to ambulate    Last Vitals:  Vitals:   04/09/18 0230 04/09/18 0557  BP: 124/82 (!) 123/92  Pulse: 77 72  Resp: 18 18  Temp: 36.9 C 36.7 C  SpO2:      Last Pain:  Vitals:   04/09/18 0710  TempSrc:   PainSc: Asleep   Pain Goal:                 Salome ArntSterling, Laporche Martelle Marie

## 2018-04-09 NOTE — Progress Notes (Signed)
CSW received consult for Sonya Peters due to history of anxiety. CSW spoke with Sonya Peters to discuss history, Sonya Peters reports that she has had generalized anxiety for quite some time and is not on any medications. Sonya Peters reports not needing any medications and that she only experiences symptoms "every now and then." Sonya Peters is not concerned about anxiety impacting her or Sonya Peters. CSW educated Sonya Peters on Sonya blues versus postpartum depression. Sonya Peters did notMarcello Moores have further questions for CSW, CSW encouraged Sonya Peters to reach out if needs arise.  Edwin Dadaarol Daren Doswell, MSW, LCSW-A Clinical Social Worker [facility] [phone]

## 2018-04-09 NOTE — Progress Notes (Signed)
Post Partum Day 1 Subjective: no complaints, up ad lib and tolerating PO  Objective: Blood pressure 129/79, pulse 80, temperature 98.2 F (36.8 C), temperature source Axillary, resp. rate 18, height 5\' 5"  (1.651 m), weight 91 kg (200 lb 9.6 oz), last menstrual period 07/08/2017, SpO2 98 %, unknown if currently breastfeeding.  Physical Exam:  General: alert and cooperative Lochia: appropriate Uterine Fundus: firm   Recent Labs    04/08/18 1516 04/09/18 0551  HGB 12.4 11.7*  HCT 36.5 34.2*    Assessment/Plan: Plan for discharge tomorrow  Will do circ tomorrow   LOS: 1 day   Oliver PilaKathy W Destin Kittler 04/09/2018, 1:50 PM

## 2018-04-09 NOTE — Lactation Note (Signed)
This note was copied from a baby's chart. Lactation Consultation Note  Patient Name: Sonya Peters's Date: 04/09/2018 Reason for consult: Initial assessment;Term  P2 mother whose infant is now 634 hours old.  Mother breastfed her 37 year old for 1 week.  Baby awake and showing feeding cues when I entered.  Offered to assist with latch and mother accepted.  Mother's breasts are soft and non tender with everted nipples.  Attempted to latch on the right breast in the cross cradle hold.  Baby very sleepy and not interested in latching at this time.  Encouraged STS.  Reviewed breastfeeding basics with mother.  Encouraged her to feed 8-12 times/24 hours or sooner if he shows cues.  Reviewed feeding cues.  Continue STS and breast compressions with feedings and hand expression after feedings.  Mom made aware of O/P services, breastfeeding support groups, community resources, and our phone # for post-discharge questions. Mom made aware of O/P services, breastfeeding support groups, community resources, and our phone # for post-discharge questions. Father present.   Maternal Data Formula Feeding for Exclusion: No Has patient been taught Hand Expression?: Yes Does the patient have breastfeeding experience prior to this delivery?: Yes  Feeding Feeding Type: Breast Fed Length of feed: 0 min  LATCH Score Latch: Too sleepy or reluctant, no latch achieved, no sucking elicited.  Audible Swallowing: None  Type of Nipple: Everted at rest and after stimulation  Comfort (Breast/Nipple): Soft / non-tender  Hold (Positioning): Assistance needed to correctly position infant at breast and maintain latch.  LATCH Score: 5  Interventions Interventions: Breast feeding basics reviewed;Assisted with latch;Skin to skin;Breast massage;Hand express;Position options;Support pillows;Adjust position;Breast compression  Lactation Tools Discussed/Used     Consult Status Consult Status:  Follow-up Date: 04/10/18 Follow-up type: In-patient    Riki Berninger R Valetta Mulroy 04/09/2018, 12:22 AM

## 2018-04-10 ENCOUNTER — Encounter (HOSPITAL_COMMUNITY): Payer: Self-pay

## 2018-04-10 MED ORDER — IBUPROFEN 600 MG PO TABS: 600.0000 mg | ORAL_TABLET | Freq: Four times a day (QID) | ORAL | 0 refills | Status: AC

## 2018-04-10 MED ORDER — ACETAMINOPHEN 325 MG PO TABS: 650.0000 mg | ORAL_TABLET | ORAL | 0 refills | Status: AC | PRN

## 2018-04-10 NOTE — Progress Notes (Signed)
Rn Reviewed information regarding MMR vaccination with patient. Rn gave information to patient .

## 2018-04-10 NOTE — Lactation Note (Signed)
This note was copied from a baby's chart. Lactation Consultation Note  Patient Name: [name] ZOXWR'UToday's Date: 04/10/2018 Reason for consult: Follow-up assessment;Infant weight loss(7% weight loss )  Baby is 5442 hours old  LC reviewed and updated the doc flow sheets per mom.  Mom mentioned the baby had been circ'd and has been sleepy/ also recently fed  Pumped milk from a bottle. Per mom  I pumped off milk with my DEBP and thinking I may pump and bottle feed.  LC explored options with moms. Mom mentioned the baby falls asleep a latched.  And left nipple is getting sore.  LC offered to assess breast tissue and mom receptive, LC noted semi compressible  Areolas/ no breakdown. LC instructed mom on the use shells between feedings except when  Sleeping. And prior to every feeding until the baby is latching consistently - breast massage, hand express,  Reverse pressure. LC Instructed mom on use breast shells, hand pump and comfort gels.  Sore nipple and engorgement prevention and tx.  Mom has DEBP - Spectra at bedside , which she has started to use.  Baby is sleepy and LC recommended to call on the nurses light with feeding cues.  Mother informed of post-discharge support and given phone number to the lactation department, including services for phone call assistance; out-patient appointments; and breastfeeding support group. List of other breastfeeding resources in the community given in the handout. Encouraged mother to call for problems or concerns related to breastfeeding.   Maternal Data Has patient been taught Hand Expression?: Yes  Feeding Feeding Type: Breast Fed Length of feed: 30 min(off and on)  LATCH Score                   Interventions Interventions: Breast feeding basics reviewed  Lactation Tools Discussed/Used Tools: Shells;Pump Shell Type: Inverted Breast pump type: Manual WIC Program: No Pump Review: Setup, frequency, and cleaning;Milk  Storage   Consult Status Consult Status: Complete Date: 04/10/18    Sonya Peters 04/10/2018, 2:30 PM

## 2018-04-10 NOTE — Progress Notes (Signed)
Post Partum Day 2 Subjective: no complaints and tolerating PO  Objective: Blood pressure 112/67, pulse 70, temperature 98.3 F (36.8 C), resp. rate 18, height 5\' 5"  (1.651 m), weight 91 kg (200 lb 9.6 oz), last menstrual period 07/08/2017, SpO2 98 %, unknown if currently breastfeeding.  Physical Exam:  General: alert and cooperative Lochia: appropriate Uterine Fundus: firm   Recent Labs    04/08/18 1516 04/09/18 0551  HGB 12.4 11.7*  HCT 36.5 34.2*    Assessment/Plan: Discharge home  May room in with baby for jaundice, will get meds from home if so   LOS: 2 days   Oliver PilaKathy W Octavius Shin 04/10/2018, 10:31 AM

## 2018-04-10 NOTE — Discharge Summary (Signed)
OB Discharge Summary     Patient Name: Sonya Peters DOB: 1981/02/15 MRN: [id number]  Date of admission: 04/08/2018 Delivering MD: Huel CoteICHARDSON, Rozell Kettlewell   Date of discharge: 04/10/2018  Admitting diagnosis: INDUCTION Intrauterine pregnancy: 420w1d     Secondary diagnosis:  Active Problems:   Indication for care in labor and delivery, antepartum   NSVD (normal spontaneous vaginal delivery)  Additional problems: CHTN, well-controlled     Discharge diagnosis: Term Pregnancy Delivered                                                                                                Post partum procedures:none  Augmentation: AROM and Pitocin  Complications: None  Hospital course:  Induction of Labor With Vaginal Delivery   37 y.o. yo G2P2002 at 7520w1d was admitted to the hospital 04/08/2018 for induction of labor.  Indication for induction: Favorable cervix at term.  Patient had an uncomplicated labor course as follows: Membrane Rupture Time/Date: 10:53 AM ,04/08/2018   Intrapartum Procedures: Episiotomy: None [1]                                         Lacerations:     Patient had delivery of a Viable infant.  Information for the patient's newborn:  Franne FortsMahathey, Boy Hadas [[id number]][[id number]]  Delivery Method: Vaginal, Spontaneous(Filed from Delivery Summary)   04/08/2018  Details of delivery can be found in separate delivery note.  Patient had a routine postpartum course. Patient is discharged home 04/10/18.  Physical exam  Vitals:   04/09/18 1400 04/09/18 1726 04/09/18 2300 04/10/18 0602  BP: 128/78 139/86 (!) 129/92 112/67  Pulse: 80 70 71 70  Resp: 18 18 18 18   Temp: 98 F (36.7 C) 98.2 F (36.8 C) 98.4 F (36.9 C) 98.3 F (36.8 C)  TempSrc: Oral Oral Oral   SpO2: 98% 98%    Weight:      Height:       General: alert and cooperative Lochia: appropriate Uterine Fundus: firm  Labs: Lab Results  Component Value Date   WBC 14.2 (H) 04/09/2018   HGB 11.7 (L) 04/09/2018   HCT  34.2 (L) 04/09/2018   MCV 87.5 04/09/2018   PLT 143 (L) 04/09/2018   CMP Latest Ref Rng & Units 04/08/2018  Glucose 70 - 99 mg/dL 756(E102(H)  BUN 6 - 20 mg/dL 11  Creatinine 3.320.44 - 9.511.00 mg/dL 8.840.50  Sodium 166135 - 063145 mmol/L 134(L)  Potassium 3.5 - 5.1 mmol/L 3.8  Chloride 98 - 111 mmol/L 103  CO2 22 - 32 mmol/L 20(L)  Calcium 8.9 - 10.3 mg/dL 9.5  Total Protein 6.5 - 8.1 g/dL 6.4(L)  Total Bilirubin 0.3 - 1.2 mg/dL 0.1(S0.2(L)  Alkaline Phos 38 - 126 U/L 168(H)  AST 15 - 41 U/L 29  ALT 0 - 44 U/L 28    Discharge instruction: per After Visit Summary and "Baby and Me Booklet".  After visit meds:  Allergies as of 04/10/2018   No Known Allergies  Medication List    TAKE these medications   acetaminophen 325 MG tablet Commonly known as:  TYLENOL Take 2 tablets (650 mg total) by mouth every 4 (four) hours as needed (for pain scale < 4). What changed:    medication strength  how much to take  when to take this  reasons to take this   ibuprofen 600 MG tablet Commonly known as:  ADVIL,MOTRIN Take 1 tablet (600 mg total) by mouth every 6 (six) hours.   labetalol 100 MG tablet Commonly known as:  NORMODYNE Take 100 mg by mouth 2 (two) times daily.   prenatal multivitamin Tabs tablet Take 1 tablet by mouth daily at 12 noon.       Diet: routine diet  Activity: Advance as tolerated. Pelvic rest for 6 weeks.   Outpatient follow up:6 weeks Follow up Appt:No future appointments. Follow up Visit:No follow-ups on file.  Postpartum contraception: Undecided  Newborn Data: Live born female  Birth Weight: 7 lb 8.5 oz (3415 g) APGAR: 8, 9  Newborn Delivery   Birth date/time:  04/08/2018 20:04:00 Delivery type:  Vaginal, Spontaneous     Baby Feeding: Breast Disposition:home with mother   04/10/2018 Oliver Pila, MD

## 2018-04-13 ENCOUNTER — Inpatient Hospital Stay (HOSPITAL_COMMUNITY)
Admission: AD | Admit: 2018-04-13 | Discharge: 2018-04-13 | Disposition: A | Discharge status: Home / Self Care | Payer: BLUE CROSS/BLUE SHIELD | Source: Ambulatory Visit | Attending: Obstetrics and Gynecology | Admitting: Obstetrics and Gynecology

## 2018-04-13 ENCOUNTER — Other Ambulatory Visit: Payer: Self-pay

## 2018-04-13 ENCOUNTER — Encounter (HOSPITAL_COMMUNITY): Payer: Self-pay

## 2018-04-13 DIAGNOSIS — I10 Essential (primary) hypertension: Secondary | ICD-10-CM | POA: Diagnosis not present

## 2018-04-13 DIAGNOSIS — O9963 Diseases of the digestive system complicating the puerperium: Secondary | ICD-10-CM | POA: Diagnosis not present

## 2018-04-13 DIAGNOSIS — O1093 Unspecified pre-existing hypertension complicating the puerperium: Secondary | ICD-10-CM | POA: Insufficient documentation

## 2018-04-13 DIAGNOSIS — O99345 Other mental disorders complicating the puerperium: Secondary | ICD-10-CM | POA: Diagnosis not present

## 2018-04-13 DIAGNOSIS — K219 Gastro-esophageal reflux disease without esophagitis: Secondary | ICD-10-CM | POA: Diagnosis not present

## 2018-04-13 DIAGNOSIS — F419 Anxiety disorder, unspecified: Secondary | ICD-10-CM | POA: Diagnosis not present

## 2018-04-13 LAB — URINALYSIS, ROUTINE W REFLEX MICROSCOPIC
Bilirubin Urine: NEGATIVE
Glucose, UA: NEGATIVE mg/dL
Ketones, ur: NEGATIVE mg/dL
Nitrite: NEGATIVE
PH: 6 (ref 5.0–8.0)
Protein, ur: NEGATIVE mg/dL
SPECIFIC GRAVITY, URINE: 1.006 (ref 1.005–1.030)

## 2018-04-13 LAB — CBC WITH DIFFERENTIAL/PLATELET
BASOS ABS: 0 10*3/uL (ref 0.0–0.1)
BASOS PCT: 0 %
Eosinophils Absolute: 0.1 10*3/uL (ref 0.0–0.7)
Eosinophils Relative: 2 %
HCT: 36.1 % (ref 36.0–46.0)
HEMOGLOBIN: 12.4 g/dL (ref 12.0–15.0)
Lymphocytes Relative: 16 %
Lymphs Abs: 1.2 10*3/uL (ref 0.7–4.0)
MCH: 30.3 pg (ref 26.0–34.0)
MCHC: 34.3 g/dL (ref 30.0–36.0)
MCV: 88.3 fL (ref 78.0–100.0)
MONOS PCT: 5 %
Monocytes Absolute: 0.4 10*3/uL (ref 0.1–1.0)
NEUTROS PCT: 77 %
Neutro Abs: 5.7 10*3/uL (ref 1.7–7.7)
Platelets: 220 10*3/uL (ref 150–400)
RBC: 4.09 MIL/uL (ref 3.87–5.11)
RDW: 13.2 % (ref 11.5–15.5)
WBC: 7.4 10*3/uL (ref 4.0–10.5)

## 2018-04-13 LAB — COMPREHENSIVE METABOLIC PANEL
ALK PHOS: 135 U/L — AB (ref 38–126)
ALT: 68 U/L — ABNORMAL HIGH (ref 0–44)
ANION GAP: 9 (ref 5–15)
AST: 54 U/L — ABNORMAL HIGH (ref 15–41)
Albumin: 3.3 g/dL — ABNORMAL LOW (ref 3.5–5.0)
BUN: 12 mg/dL (ref 6–20)
CALCIUM: 8.7 mg/dL — AB (ref 8.9–10.3)
CO2: 24 mmol/L (ref 22–32)
Chloride: 106 mmol/L (ref 98–111)
Creatinine, Ser: 0.62 mg/dL (ref 0.44–1.00)
GFR calc Af Amer: >60 mL/min (ref >60)
GFR calc non Af Amer: >60 mL/min (ref >60)
Glucose, Bld: 108 mg/dL — ABNORMAL HIGH (ref 70–99)
Potassium: 4 mmol/L (ref 3.5–5.1)
SODIUM: 139 mmol/L (ref 135–145)
TOTAL PROTEIN: 7 g/dL (ref 6.5–8.1)
Total Bilirubin: 0.3 mg/dL (ref 0.3–1.2)

## 2018-04-13 LAB — PROTEIN / CREATININE RATIO, URINE
CREATININE, URINE: 34 mg/dL
PROTEIN CREATININE RATIO: 0.24 mg/mg{creat} — AB (ref 0.00–0.15)
Total Protein, Urine: 8 mg/dL

## 2018-04-13 NOTE — Discharge Instructions (Signed)
Preeclampsia and Eclampsia °Preeclampsia is a serious condition that develops only during pregnancy. It is also called toxemia of pregnancy. This condition causes high blood pressure along with other symptoms, such as swelling and headaches. These symptoms may develop as the condition gets worse. Preeclampsia may occur at 20 weeks of pregnancy or later. °Diagnosing and treating preeclampsia early is very important. If not treated early, it can cause serious problems for you and your baby. One problem it can lead to is eclampsia, which is a condition that causes muscle jerking or shaking (convulsions or seizures) in the mother. Delivering your baby is the best treatment for preeclampsia or eclampsia. Preeclampsia and eclampsia symptoms usually go away after your baby is born. °What are the causes? °The cause of preeclampsia is not known. °What increases the risk? °The following risk factors make you more likely to develop preeclampsia: °· Being pregnant for the first time. °· Having had preeclampsia during a past pregnancy. °· Having a family history of preeclampsia. °· Having high blood pressure. °· Being pregnant with twins or triplets. °· Being 35 or older. °· Being African-American. °· Having kidney disease or diabetes. °· Having medical conditions such as lupus or blood diseases. °· Being very overweight (obese). ° °What are the signs or symptoms? °The earliest signs of preeclampsia are: °· High blood pressure. °· Increased protein in your urine. Your health care provider will check for this at every visit before you give birth (prenatal visit). ° °Other symptoms that may develop as the condition gets worse include: °· Severe headaches. °· Sudden weight gain. °· Swelling of the hands, face, legs, and feet. °· Nausea and vomiting. °· Vision problems, such as blurred or double vision. °· Numbness in the face, arms, legs, and feet. °· Urinating less than usual. °· Dizziness. °· Slurred speech. °· Abdominal pain,  especially upper abdominal pain. °· Convulsions or seizures. ° °Symptoms generally go away after giving birth. °How is this diagnosed? °There are no screening tests for preeclampsia. Your health care provider will ask you about symptoms and check for signs of preeclampsia during your prenatal visits. You may also have tests that include: °· Urine tests. °· Blood tests. °· Checking your blood pressure. °· Monitoring your baby’s heart rate. °· Ultrasound. ° °How is this treated? °You and your health care provider will determine the treatment approach that is best for you. Treatment may include: °· Having more frequent prenatal exams to check for signs of preeclampsia, if you have an increased risk for preeclampsia. °· Bed rest. °· Reducing how much salt (sodium) you eat. °· Medicine to lower your blood pressure. °· Staying in the hospital, if your condition is severe. There, treatment will focus on controlling your blood pressure and the amount of fluids in your body (fluid retention). °· You may need to take medicine (magnesium sulfate) to prevent seizures. This medicine may be given as an injection or through an IV tube. °· Delivering your baby early, if your condition gets worse. You may have your labor started with medicine (induced), or you may have a cesarean delivery. ° °Follow these instructions at home: °Eating and drinking ° °· Drink enough fluid to keep your urine clear or pale yellow. °· Eat a healthy diet that is low in sodium. Do not add salt to your food. Check nutrition labels to see how much sodium a food or beverage contains. °· Avoid caffeine. °Lifestyle °· Do not use any products that contain nicotine or tobacco, such as cigarettes   and e-cigarettes. If you need help quitting, ask your health care provider. °· Do not use alcohol or drugs. °· Avoid stress as much as possible. Rest and get plenty of sleep. °General instructions °· Take over-the-counter and prescription medicines only as told by your  health care provider. °· When lying down, lie on your side. This keeps pressure off of your baby. °· When sitting or lying down, raise (elevate) your feet. Try putting some pillows underneath your lower legs. °· Exercise regularly. Ask your health care provider what kinds of exercise are best for you. °· Keep all follow-up and prenatal visits as told by your health care provider. This is important. °How is this prevented? °To prevent preeclampsia or eclampsia from developing during another pregnancy: °· Get proper medical care during pregnancy. Your health care provider may be able to prevent preeclampsia or diagnose and treat it early. °· Your health care provider may have you take a low-dose aspirin or a calcium supplement during your next pregnancy. °· You may have tests of your blood pressure and kidney function after giving birth. °· Maintain a healthy weight. Ask your health care provider for help managing weight gain during pregnancy. °· Work with your health care provider to manage any long-term (chronic) health conditions you have, such as diabetes or kidney problems. ° °Contact a health care provider if: °· You gain more weight than expected. °· You have headaches. °· You have nausea or vomiting. °· You have abdominal pain. °· You feel dizzy or light-headed. °Get help right away if: °· You develop sudden or severe swelling anywhere in your body. This usually happens in the legs. °· You gain 5 lbs (2.3 kg) or more during one week. °· You have severe: °? Abdominal pain. °? Headaches. °? Dizziness. °? Vision problems. °? Confusion. °? Nausea or vomiting. °· You have a seizure. °· You have trouble moving any part of your body. °· You develop numbness in any part of your body. °· You have trouble speaking. °· You have any abnormal bleeding. °· You pass out. °This information is not intended to replace advice given to you by your health care provider. Make sure you discuss any questions you have with your health  care provider. °Document Released: 09/04/2000 Document Revised: 05/05/2016 Document Reviewed: 04/13/2016 °Elsevier Interactive Patient Education © 2018 [employer] ° °

## 2018-04-13 NOTE — MAU Provider Note (Signed)
History     CSN: [id number]  Arrival date and time: 04/13/18 1338   First Provider Initiated Contact with Patient 04/13/18 1551      Chief Complaint  Patient presents with  . Hypertension   HPI  Ms. Sonya Peters is a 37 y.o. [id number] PPD #6 who presents to [facility]U today with complaint of elevated blood pressure. She denies HA, visual changes, RUQ abdominal pain or change in peripheral edema. She has CHTN and is on Labetalol 100 mg BID and had previously been well-controlled. She states her main concern was a chest tightness noted last night and again this morning that has resolved.    OB History    Gravida  2   Para  2   Term  2   Preterm      AB      Living  2     SAB      TAB      Ectopic      Multiple  0   Live Births  2           Past Medical History:  Diagnosis Date  . Anxiety   . GERD (gastroesophageal reflux disease)   . Hypertension     History reviewed. No pertinent surgical history.  History reviewed. No pertinent family history.  Social History   Tobacco Use  . Smoking status: Never Smoker  . Smokeless tobacco: Never Used  Substance Use Topics  . Alcohol use: Not Currently  . Drug use: Never    Allergies: No Known Allergies  No medications prior to admission.    Review of Systems  Constitutional: Negative for fever.  Eyes: Negative for visual disturbance.  Cardiovascular: Positive for leg swelling.  Gastrointestinal: Negative for abdominal pain, constipation, diarrhea, nausea and vomiting.  Genitourinary: Positive for vaginal bleeding.  Neurological: Negative for headaches.   Physical Exam   Blood pressure (!) 150/82, pulse 63, temperature 98.5 F (36.9 C), temperature source Oral, resp. rate 17, SpO2 100 %, currently breastfeeding.  Physical Exam  Nursing note and vitals reviewed. Constitutional: She is oriented to person, place, and time. She appears well-developed and well-nourished. No distress.  HENT:  Head:  Normocephalic and atraumatic.  Cardiovascular: Normal rate.  Respiratory: Effort normal.  GI: Soft. She exhibits no distension and no mass. There is no tenderness. There is no rebound and no guarding.  Musculoskeletal: She exhibits edema (mild non-pitting edema to the mid-shin).  Neurological: She is alert and oriented to person, place, and time. She has normal reflexes.  No clonus  Skin: Skin is warm and dry. No erythema.  Psychiatric: She has a normal mood and affect.    Results for orders placed or performed during the hospital encounter of 04/13/18 (from the past 24 hour(s))  CBC with Differential/Platelet     Status: None   Collection Time: 04/13/18  3:29 PM  Result Value Ref Range   WBC 7.4 4.0 - 10.5 K/uL   RBC 4.09 3.87 - 5.11 MIL/uL   Hemoglobin 12.4 12.0 - 15.0 g/dL   HCT 14.7 82.9 - 56.2 %   MCV 88.3 78.0 - 100.0 fL   MCH 30.3 26.0 - 34.0 pg   MCHC 34.3 30.0 - 36.0 g/dL   RDW 13.0 86.5 - 78.4 %   Platelets 220 150 - 400 K/uL   Neutrophils Relative % 77 %   Neutro Abs 5.7 1.7 - 7.7 K/uL   Lymphocytes Relative 16 %   Lymphs Abs 1.2 0.7 -  4.0 K/uL   Monocytes Relative 5 %   Monocytes Absolute 0.4 0.1 - 1.0 K/uL   Eosinophils Relative 2 %   Eosinophils Absolute 0.1 0.0 - 0.7 K/uL   Basophils Relative 0 %   Basophils Absolute 0.0 0.0 - 0.1 K/uL  Comprehensive metabolic panel     Status: Abnormal   Collection Time: 04/13/18  3:29 PM  Result Value Ref Range   Sodium 139 135 - 145 mmol/L   Potassium 4.0 3.5 - 5.1 mmol/L   Chloride 106 98 - 111 mmol/L   CO2 24 22 - 32 mmol/L   Glucose, Bld 108 (H) 70 - 99 mg/dL   BUN 12 6 - 20 mg/dL   Creatinine, Ser 1.610.62 0.44 - 1.00 mg/dL   Calcium 8.7 (L) 8.9 - 10.3 mg/dL   Total Protein 7.0 6.5 - 8.1 g/dL   Albumin 3.3 (L) 3.5 - 5.0 g/dL   AST 54 (H) 15 - 41 U/L   ALT 68 (H) 0 - 44 U/L   Alkaline Phosphatase 135 (H) 38 - 126 U/L   Total Bilirubin 0.3 0.3 - 1.2 mg/dL   GFR calc non Af Amer >60 >60 mL/min   GFR calc Af Amer >60  >60 mL/min   Anion gap 9 5 - 15  Protein / creatinine ratio, urine     Status: Abnormal   Collection Time: 04/13/18  4:11 PM  Result Value Ref Range   Creatinine, Urine 34.00 mg/dL   Total Protein, Urine 8 mg/dL   Protein Creatinine Ratio 0.24 (H) 0.00 - 0.15 mg/mg[Cre]  Urinalysis, Routine w reflex microscopic     Status: Abnormal   Collection Time: 04/13/18  4:15 PM  Result Value Ref Range   Color, Urine YELLOW YELLOW   APPearance CLEAR CLEAR   Specific Gravity, Urine 1.006 1.005 - 1.030   pH 6.0 5.0 - 8.0   Glucose, UA NEGATIVE NEGATIVE mg/dL   Hgb urine dipstick LARGE (A) NEGATIVE   Bilirubin Urine NEGATIVE NEGATIVE   Ketones, ur NEGATIVE NEGATIVE mg/dL   Protein, ur NEGATIVE NEGATIVE mg/dL   Nitrite NEGATIVE NEGATIVE   Leukocytes, UA SMALL (A) NEGATIVE   RBC / HPF 21-50 0 - 5 RBC/hpf   WBC, UA 11-20 0 - 5 WBC/hpf   Bacteria, UA RARE (A) NONE SEEN   Squamous Epithelial / LPF 0-5 0 - 5   Non Squamous Epithelial 0-5 (A) NONE SEEN   Patient Vitals for the past 24 hrs:  BP Temp Temp src Pulse Resp SpO2  04/13/18 1735 (!) 150/82 - - 63 - -  04/13/18 1731 (!) 150/82 - - 63 - -  04/13/18 1701 (!) 148/87 - - 65 - -  04/13/18 1646 (!) 145/77 - - 66 - -  04/13/18 1631 (!) 149/85 - - 66 - -  04/13/18 1626 (!) 146/86 - - 67 - -  04/13/18 1604 (!) 142/76 - - 73 - -  04/13/18 1533 (!) 160/91 - - 69 - -  04/13/18 1433 (!) 151/92 98.5 F (36.9 C) Oral 72 17 100 %    MAU Course  Procedures None  MDM CBC, CMP, UA and Urine protein/creatine ratio  Serial BPs  Discussed patient with Dr. Jackelyn KnifeMeisinger. Ok for discharge at this time. Continue labetalol as previously prescribed and call the office for BP check and repeat labs on Friday.   Assessment and Plan  A:  PPD #6 CHTN  P:  Discharge home Continue labetalol  Warning signs for worsening HTN/pre-eclampsia discussed  Patient advised to follow-up with [facility] on Friday for BP check and repeat labs Patient may return  to MAU as needed or if her condition were to change or worsen  Vonzella Nipple, PA-C 04/13/2018, 8:25 PM

## 2018-04-13 NOTE — MAU Note (Addendum)
Last night started feeling tightness in her chest like she could barely breath, . Still felt the same this morning this morning, finally stopped at 1230. Not really a headache, but feels a fullness in her head.  Checked her bp, was 151/108. Then 132/95,145/95 at 1130. Denies HA, visual changes, or epigastric pain. Does have swelling in her ankles.  Vag del 7/19.
